# Patient Record
Sex: Male | Born: 1995 | Race: White | Hispanic: No | Marital: Single | Smoking: Never smoker
Health system: Southern US, Community
[De-identification: ages and names within clinical notes are randomized; demographics above are authoritative.]

---
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
## 2019-04-23 ENCOUNTER — Ambulatory Visit: Payer: 59 | Attending: Internal Medicine

## 2019-04-23 DIAGNOSIS — Z20822 Contact with and (suspected) exposure to covid-19: Secondary | ICD-10-CM | POA: Diagnosis not present

## 2019-04-23 MED FILL — BREO ELLIPTA 200-25 MCG INH: 200-25 | 30 days supply | Qty: 60 | Fill #0

## 2019-04-23 MED FILL — ALBUTEROL SULFATE HFA 108 (: 108 (90 BAS | 25 days supply | Qty: 9 | Fill #0

## 2019-04-25 LAB — NOVEL CORONAVIRUS, NAA: SARS-CoV-2, NAA: DETECTED — AB

## 2019-04-25 MED FILL — AZITHROMYCIN 250 MG TABLET: 250 | 5 days supply | Qty: 6 | Fill #0

## 2019-07-23 DIAGNOSIS — T1502XA Foreign body in cornea, left eye, initial encounter: Secondary | ICD-10-CM | POA: Diagnosis not present

## 2019-12-09 DIAGNOSIS — M25531 Pain in right wrist: Secondary | ICD-10-CM | POA: Diagnosis not present

## 2019-12-10 MED FILL — MELOXICAM 7.5 MG TABLET: 7.5 | 30 days supply | Qty: 30 | Fill #0

## 2019-12-11 DIAGNOSIS — S139XXA Sprain of joints and ligaments of unspecified parts of neck, initial encounter: Secondary | ICD-10-CM | POA: Diagnosis not present

## 2019-12-17 DIAGNOSIS — S139XXA Sprain of joints and ligaments of unspecified parts of neck, initial encounter: Secondary | ICD-10-CM | POA: Diagnosis not present

## 2020-05-14 DIAGNOSIS — Z1159 Encounter for screening for other viral diseases: Secondary | ICD-10-CM | POA: Diagnosis not present

## 2021-06-22 ENCOUNTER — Encounter: Payer: Self-pay | Admitting: Emergency Medicine

## 2021-06-22 ENCOUNTER — Ambulatory Visit
Admission: EM | Admit: 2021-06-22 | Discharge: 2021-06-22 | Disposition: A | Discharge status: Home / Self Care | Payer: 59 | Attending: Emergency Medicine | Admitting: Emergency Medicine

## 2021-06-22 DIAGNOSIS — J302 Other seasonal allergic rhinitis: Secondary | ICD-10-CM

## 2021-06-22 MED ORDER — IPRATROPIUM BROMIDE 0.06 % NA SOLN: 2.0000 | Freq: Four times a day (QID) | NASAL | 2 refills | Status: DC

## 2021-06-22 MED ORDER — IBUPROFEN 400 MG PO TABS: 400.0000 mg | ORAL_TABLET | Freq: Three times a day (TID) | ORAL | 0 refills | Status: AC | PRN

## 2021-06-22 MED ORDER — FLUTICASONE PROPIONATE 50 MCG/ACT NA SUSP: 2.0000 | Freq: Every day | NASAL | 1 refills | Status: DC

## 2021-06-22 MED ORDER — CETIRIZINE HCL 10 MG PO TABS: 10.0000 mg | ORAL_TABLET | Freq: Every day | ORAL | 1 refills | Status: DC

## 2021-06-22 NOTE — ED Triage Notes (Signed)
Last week had dry cough and some congestion. Now cough become more congested. Reports headache.  ?

## 2021-06-22 NOTE — Discharge Instructions (Addendum)
Your symptoms and my physical exam findings are concerning for exacerbation of your underlying allergies.  It is important that you are consistent with taking allergy medications exactly as prescribed.   ?  ?Please see the list below for recommended medications, dosages and frequencies to provide relief of your current symptoms:   ?  ?Zyrtec (cetirizine): This is an excellent second-generation antihistamine that helps to reduce respiratory inflammatory response to environmental allergens.  In some patients, this medication can cause daytime sleepiness so I recommend that you take 1 tablet daily at bedtime.   ?  ?Flonase (fluticasone): This is a steroid nasal spray that you use once daily, 1 spray in each nare.  This medication does not work well if you decide to use it only used as you feel you need to, it works best used on a daily basis.  After 3 to 5 days of use, you will notice significant reduction of the inflammation and mucus production that is currently being caused by exposure to allergens, whether seasonal or environmental.  The most common side effect of this medication is nosebleeds.  If you experience a nosebleed, please discontinue use for 1 week, then feel free to resume.  I have provided you with a prescription but you can also purchase this medication over-the-counter if your insurance will not cover it. ?  ?Ipratropium (Atrovent): This is an excellent nasal decongestant spray that does not cause rebound congestion, please instill 2 sprays into each nare with each use.  Because nasal steroids can take several days before they begin to provide full benefit, I recommend that you use this spray in addition to the nasal steroid prescribed for you.  Please use it after you have used your nasal steroid and repeat up to 4 times daily as needed.  I have provided you with a prescription for this medication.    ?  ?Ibuprofen  (Advil, Motrin): This is a good anti-inflammatory medication which not only  addresses aches, pains but also significantly reduces soft tissue inflammation of the upper airways that causes sinus and nasal congestion as well as inflammation of the lower airways which makes you feel like your breathing is constricted or your cough feel tight.  I recommend that you take between 400 to 600 mg every 6-8 hours as needed.    ?  ?If you find that you have not had significant relief of your symptoms in the next 7 to 10 days, please follow-up with your primary care provider ?  ?Thank you for visiting urgent care today.  We appreciate the opportunity to participate in your care. ? ?

## 2021-06-22 NOTE — ED Provider Notes (Signed)
[facility]    CSN: [id number] Arrival date & time: 06/22/21  1117    HISTORY   Chief Complaint  Patient presents with   Cough   HPI Carl Wilkerson is a 26 y.o. male. Last week had dry cough and some congestion. Now cough become more congested. Reports headache.  Reports history of asthma when he was younger but has not had to use inhalers for about 10 years.  The history is provided by the patient.  History reviewed. No pertinent past medical history. There are no problems to display for this patient.  History reviewed. No pertinent surgical history.  Home Medications    Prior to Admission medications   Medication Sig Start Date End Date Taking? Authorizing Provider  cetirizine (ZYRTEC) 10 MG tablet Take 1 tablet (10 mg total) by mouth daily. 06/22/21 12/19/21 Yes Theadora Rama Scales, PA-C  fluticasone (FLONASE) 50 MCG/ACT nasal spray Place 2 sprays into both nostrils daily. 06/22/21  Yes Theadora Rama Scales, PA-C  ibuprofen (ADVIL) 400 MG tablet Take 1 tablet (400 mg total) by mouth every 8 (eight) hours as needed for up to 30 doses. 06/22/21  Yes Theadora Rama Scales, PA-C  ipratropium (ATROVENT) 0.06 % nasal spray Place 2 sprays into both nostrils 4 (four) times daily. As needed for nasal congestion, runny nose 06/22/21  Yes Theadora Rama Scales, PA-C   Family History No family history on file. Social History Social History   Tobacco Use   Smoking status: Never   Smokeless tobacco: Never  Substance Use Topics   Alcohol use: Not Currently   Allergies   Patient has no known allergies.  Review of Systems Review of Systems Pertinent findings noted in history of present illness.   Physical Exam Triage Vital Signs ED Triage Vitals  Enc Vitals Group     BP 02/12/21 0827 (!) 147/82     Pulse Rate 02/12/21 0827 72     Resp 02/12/21 0827 18     Temp 02/12/21 0827 98.3 F (36.8 C)     Temp Source 02/12/21 0827 Oral     SpO2 02/12/21 0827 98 %      Weight --      Height --      Head Circumference --      Peak Flow --      Pain Score 02/12/21 0826 5     Pain Loc --      Pain Edu? --      Excl. in GC? --   No data found.  Updated Vital Signs BP 108/72 (BP Location: Left Arm)    Pulse 61    Temp 98.4 F (36.9 C) (Oral)    Resp 15    SpO2 95%   Physical Exam Vitals and nursing note reviewed.  Constitutional:      General: He is not in acute distress.    Appearance: Normal appearance. He is not ill-appearing.  HENT:     Head: Normocephalic and atraumatic.     Salivary Glands: Right salivary gland is not diffusely enlarged or tender. Left salivary gland is not diffusely enlarged or tender.     Right Ear: Ear canal and external ear normal. No drainage. A middle ear effusion is present. There is no impacted cerumen. Tympanic membrane is bulging. Tympanic membrane is not injected or erythematous.     Left Ear: Ear canal and external ear normal. No drainage. A middle ear effusion is present. There is no impacted cerumen. Tympanic membrane is  bulging. Tympanic membrane is not injected or erythematous.     Ears:     Comments: Bilateral EACs normal, both TMs bulging with clear fluid    Nose: Rhinorrhea present. No nasal deformity, septal deviation, signs of injury, nasal tenderness, mucosal edema or congestion. Rhinorrhea is clear.     Right Nostril: Occlusion present. No foreign body, epistaxis or septal hematoma.     Left Nostril: Occlusion present. No foreign body, epistaxis or septal hematoma.     Right Turbinates: Enlarged, swollen and pale.     Left Turbinates: Enlarged, swollen and pale.     Right Sinus: No maxillary sinus tenderness or frontal sinus tenderness.     Left Sinus: No maxillary sinus tenderness or frontal sinus tenderness.     Mouth/Throat:     Lips: Pink. No lesions.     Mouth: Mucous membranes are moist. No oral lesions.     Pharynx: Oropharynx is clear. Uvula midline. No posterior oropharyngeal erythema or uvula  swelling.     Tonsils: No tonsillar exudate. 0 on the right. 0 on the left.     Comments: Postnasal drip Eyes:     General: Lids are normal.        Right eye: No discharge.        Left eye: No discharge.     Extraocular Movements: Extraocular movements intact.     Conjunctiva/sclera: Conjunctivae normal.     Right eye: Right conjunctiva is not injected.     Left eye: Left conjunctiva is not injected.  Neck:     Trachea: Trachea and phonation normal.  Cardiovascular:     Rate and Rhythm: Normal rate and regular rhythm.     Pulses: Normal pulses.     Heart sounds: Normal heart sounds. No murmur heard.   No friction rub. No gallop.  Pulmonary:     Effort: Pulmonary effort is normal. No accessory muscle usage, prolonged expiration or respiratory distress.     Breath sounds: Normal breath sounds. No stridor, decreased air movement or transmitted upper airway sounds. No decreased breath sounds, wheezing, rhonchi or rales.  Chest:     Chest wall: No tenderness.  Musculoskeletal:        General: Normal range of motion.     Cervical back: Normal range of motion and neck supple. Normal range of motion.  Lymphadenopathy:     Cervical: No cervical adenopathy.  Skin:    General: Skin is warm and dry.     Findings: No erythema or rash.  Neurological:     General: No focal deficit present.     Mental Status: He is alert and oriented to person, place, and time.  Psychiatric:        Mood and Affect: Mood normal.        Behavior: Behavior normal.    Visual Acuity Right Eye Distance:   Left Eye Distance:   Bilateral Distance:    Right Eye Near:   Left Eye Near:    Bilateral Near:     UC Couse / Diagnostics / Procedures:    EKG  Radiology No results found.  Procedures Procedures (including critical care time)  UC Diagnoses / Final Clinical Impressions(s)   I have reviewed the triage vital signs and the nursing notes.  Pertinent labs & imaging results that were available  during my care of the patient were reviewed by me and considered in my medical decision making (see chart for details).   Final diagnoses:  Seasonal allergic  rhinitis, unspecified trigger   Patient provided with prescriptions for cetirizine, Flonase, ibuprofen ipratropium nasal spray.  Recommend patient continue these medications through spring.  Return precautions advised.  No indication for viral or bacterial testing due to no signs of infection this time.  ED Prescriptions     Medication Sig Dispense Auth. Provider   cetirizine (ZYRTEC) 10 MG tablet Take 1 tablet (10 mg total) by mouth daily. 90 tablet Theadora Rama Scales, PA-C   fluticasone (FLONASE) 50 MCG/ACT nasal spray Place 2 sprays into both nostrils daily. 54 mL Theadora Rama Scales, PA-C   ipratropium (ATROVENT) 0.06 % nasal spray Place 2 sprays into both nostrils 4 (four) times daily. As needed for nasal congestion, runny nose 15 mL Theadora Rama Scales, PA-C   ibuprofen (ADVIL) 400 MG tablet Take 1 tablet (400 mg total) by mouth every 8 (eight) hours as needed for up to 30 doses. 30 tablet Theadora Rama Scales, PA-C      PDMP not reviewed this encounter.  Pending results:  Labs Reviewed - No data to display  Medications Ordered in UC: Medications - No data to display  Disposition Upon Discharge:  Condition: stable for discharge home Home: take medications as prescribed; routine discharge instructions as discussed; follow up as advised.  Patient presented with an acute illness with associated systemic symptoms and significant discomfort requiring urgent management. In my opinion, this is a condition that a prudent lay person (someone who possesses an average knowledge of health and medicine) may potentially expect to result in complications if not addressed urgently such as respiratory distress, impairment of bodily function or dysfunction of bodily organs.   Routine symptom specific, illness specific and/or  disease specific instructions were discussed with the patient and/or caregiver at length.   As such, the patient has been evaluated and assessed, work-up was performed and treatment was provided in alignment with urgent care protocols and evidence based medicine.  Patient/parent/caregiver has been advised that the patient may require follow up for further testing and treatment if the symptoms continue in spite of treatment, as clinically indicated and appropriate.  If the patient was tested for COVID-19, Influenza and/or RSV, then the patient/parent/guardian was advised to isolate at home pending the results of his/her diagnostic coronavirus test and potentially longer if theyre positive. I have also advised pt that if his/her COVID-19 test returns positive, it's recommended to self-isolate for at least 10 days after symptoms first appeared AND until fever-free for 24 hours without fever reducer AND other symptoms have improved or resolved. Discussed self-isolation recommendations as well as instructions for household member/close contacts as per the [facility] and [place] DHHS, and also gave patient the COVID packet with this information.  Patient/parent/caregiver has been advised to return to the [facility] or PCP in 3-5 days if no better; to PCP or the Emergency Department if new signs and symptoms develop, or if the current signs or symptoms continue to change or worsen for further workup, evaluation and treatment as clinically indicated and appropriate  The patient will follow up with their current PCP if and as advised. If the patient does not currently have a PCP we will assist them in obtaining one.   The patient may need specialty follow up if the symptoms continue, in spite of conservative treatment and management, for further workup, evaluation, consultation and treatment as clinically indicated and appropriate.  Patient/parent/caregiver verbalized understanding and agreement of plan as discussed.  All  questions were addressed during visit.  Please see discharge  instructions below for further details of plan.  Discharge Instructions:   Discharge Instructions      Your symptoms and my physical exam findings are concerning for exacerbation of your underlying allergies.  It is important that you are consistent with taking allergy medications exactly as prescribed.     Please see the list below for recommended medications, dosages and frequencies to provide relief of your current symptoms:     Zyrtec (cetirizine): This is an excellent second-generation antihistamine that helps to reduce respiratory inflammatory response to environmental allergens.  In some patients, this medication can cause daytime sleepiness so I recommend that you take 1 tablet daily at bedtime.     Flonase (fluticasone): This is a steroid nasal spray that you use once daily, 1 spray in each nare.  This medication does not work well if you decide to use it only used as you feel you need to, it works best used on a daily basis.  After 3 to 5 days of use, you will notice significant reduction of the inflammation and mucus production that is currently being caused by exposure to allergens, whether seasonal or environmental.  The most common side effect of this medication is nosebleeds.  If you experience a nosebleed, please discontinue use for 1 week, then feel free to resume.  I have provided you with a prescription but you can also purchase this medication over-the-counter if your insurance will not cover it.   Ipratropium (Atrovent): This is an excellent nasal decongestant spray that does not cause rebound congestion, please instill 2 sprays into each nare with each use.  Because nasal steroids can take several days before they begin to provide full benefit, I recommend that you use this spray in addition to the nasal steroid prescribed for you.  Please use it after you have used your nasal steroid and repeat up to 4 times daily as  needed.  I have provided you with a prescription for this medication.      Ibuprofen  (Advil, Motrin): This is a good anti-inflammatory medication which not only addresses aches, pains but also significantly reduces soft tissue inflammation of the upper airways that causes sinus and nasal congestion as well as inflammation of the lower airways which makes you feel like your breathing is constricted or your cough feel tight.  I recommend that you take between 400 to 600 mg every 6-8 hours as needed.      If you find that you have not had significant relief of your symptoms in the next 7 to 10 days, please follow-up with your primary care provider   Thank you for visiting urgent care today.  We appreciate the opportunity to participate in your care.     This office note has been dictated using Teaching laboratory technician.  Unfortunately, and despite my best efforts, this method of dictation can sometimes lead to occasional typographical or grammatical errors.  I apologize in advance if this occurs.     Theadora Rama Scales, PA-C 06/22/21 1535

## 2021-07-20 DIAGNOSIS — L237 Allergic contact dermatitis due to plants, except food: Secondary | ICD-10-CM | POA: Diagnosis not present

## 2022-02-28 ENCOUNTER — Telehealth: Payer: 59 | Admitting: Family

## 2022-02-28 DIAGNOSIS — J4520 Mild intermittent asthma, uncomplicated: Secondary | ICD-10-CM | POA: Diagnosis not present

## 2022-02-28 DIAGNOSIS — U071 COVID-19: Secondary | ICD-10-CM

## 2022-02-28 MED ORDER — ALBUTEROL SULFATE HFA 108 (90 BASE) MCG/ACT IN AERS: 2.0000 | INHALATION_SPRAY | Freq: Four times a day (QID) | RESPIRATORY_TRACT | 0 refills | Status: AC | PRN

## 2022-02-28 MED ORDER — MOLNUPIRAVIR EUA 200MG CAPSULE: 4.0000 | ORAL_CAPSULE | Freq: Two times a day (BID) | ORAL | 0 refills | Status: AC

## 2022-02-28 MED ORDER — BENZONATATE 100 MG PO CAPS: 100.0000 mg | ORAL_CAPSULE | Freq: Three times a day (TID) | ORAL | 0 refills | Status: AC | PRN

## 2022-02-28 NOTE — Patient Instructions (Signed)

## 2022-02-28 NOTE — Progress Notes (Signed)
Virtual Visit Consent   Carl Wilkerson, you are scheduled for a virtual visit with a [facility] provider today. Just as with appointments in the office, your consent must be obtained to participate. Your consent will be active for this visit and any virtual visit you may have with one of our providers in the next 365 days. If you have a MyChart account, a copy of this consent can be sent to you electronically.  As this is a virtual visit, video technology does not allow for your provider to perform a traditional examination. This may limit your provider's ability to fully assess your condition. If your provider identifies any concerns that need to be evaluated in person or the need to arrange testing (such as labs, EKG, etc.), we will make arrangements to do so. Although advances in technology are sophisticated, we cannot ensure that it will always work on either your end or our end. If the connection with a video visit is poor, the visit may have to be switched to a telephone visit. With either a video or telephone visit, we are not always able to ensure that we have a secure connection.  By engaging in this virtual visit, you consent to the provision of healthcare and authorize for your insurance to be billed (if applicable) for the services provided during this visit. Depending on your insurance coverage, you may receive a charge related to this service.  I need to obtain your verbal consent now. Are you willing to proceed with your visit today? Carl Wilkerson has provided verbal consent on 02/28/2022 for a virtual visit (video or telephone). Jannifer Rodney, FNP  Date: 02/28/2022 6:29 PM  Virtual Visit via Video Note   I, Jannifer Rodney, connected with  Carl Wilkerson  ([id number], 1995/10/27) on 02/28/22 at  6:30 PM EST by a video-enabled telemedicine application and verified that I am speaking with the correct person using two identifiers.  Location: Patient: Virtual Visit Location  Patient: Home Provider: Virtual Visit Location Provider: Home Office   I discussed the limitations of evaluation and management by telemedicine and the availability of in person appointments. The patient expressed understanding and agreed to proceed.    History of Present Illness: Carl Wilkerson is a 26 y.o. who identifies as a male who was assigned male at birth, and is being seen today for COVID. He reports his symptoms started today and tested positive today.   HPI: Cough This is a new problem. The current episode started today. The problem has been unchanged. The cough is Non-productive. Associated symptoms include a fever, headaches, myalgias, nasal congestion and postnasal drip. Pertinent negatives include no chills, ear congestion, ear pain, shortness of breath or wheezing. He has tried rest and OTC cough suppressant for the symptoms. The treatment provided mild relief.    Problems: There are no problems to display for this patient.   Allergies: No Known Allergies Medications:  Current Outpatient Medications:    albuterol (VENTOLIN HFA) 108 (90 Base) MCG/ACT inhaler, Inhale 2 puffs into the lungs every 6 (six) hours as needed for wheezing or shortness of breath., Disp: 8 g, Rfl: 0   benzonatate (TESSALON PERLES) 100 MG capsule, Take 1 capsule (100 mg total) by mouth 3 (three) times daily as needed., Disp: 20 capsule, Rfl: 0   molnupiravir EUA (LAGEVRIO) 200 mg CAPS capsule, Take 4 capsules (800 mg total) by mouth 2 (two) times daily for 5 days., Disp: 40 capsule, Rfl: 0   ibuprofen (ADVIL)  400 MG tablet, Take 1 tablet (400 mg total) by mouth every 8 (eight) hours as needed for up to 30 doses., Disp: 30 tablet, Rfl: 0  Observations/Objective: Patient is well-developed, well-nourished in no acute distress.  Resting comfortably  at home.  Head is normocephalic, atraumatic.  No labored breathing.  Speech is clear and coherent with logical content.  Patient is alert and oriented at  baseline.  Nasal congestion  Assessment and Plan: 1. COVID-19 - molnupiravir EUA (LAGEVRIO) 200 mg CAPS capsule; Take 4 capsules (800 mg total) by mouth 2 (two) times daily for 5 days.  Dispense: 40 capsule; Refill: 0 - albuterol (VENTOLIN HFA) 108 (90 Base) MCG/ACT inhaler; Inhale 2 puffs into the lungs every 6 (six) hours as needed for wheezing or shortness of breath.  Dispense: 8 g; Refill: 0 - benzonatate (TESSALON PERLES) 100 MG capsule; Take 1 capsule (100 mg total) by mouth 3 (three) times daily as needed.  Dispense: 20 capsule; Refill: 0  2. Mild intermittent asthma without complication  COVID positive, rest, force fluids, tylenol as needed, Quarantine for at least 5 days and you are fever free, then must wear a mask out in public from day 6-10, report any worsening symptoms such as increased shortness of breath, swelling, or continued high fevers. Possible adverse effects discussed with antivirals.    Follow Up Instructions: I discussed the assessment and treatment plan with the patient. The patient was provided an opportunity to ask questions and all were answered. The patient agreed with the plan and demonstrated an understanding of the instructions.  A copy of instructions were sent to the patient via MyChart unless otherwise noted below.     The patient was advised to call back or seek an in-person evaluation if the symptoms worsen or if the condition fails to improve as anticipated.  Time:  I spent 5 minutes with the patient via telehealth technology discussing the above problems/concerns.    Jannifer Rodney, FNP

## 2022-09-23 ENCOUNTER — Other Ambulatory Visit (HOSPITAL_BASED_OUTPATIENT_CLINIC_OR_DEPARTMENT_OTHER): Payer: Self-pay

## 2023-03-07 ENCOUNTER — Telehealth: Payer: 59 | Admitting: Family Medicine

## 2023-03-07 DIAGNOSIS — R21 Rash and other nonspecific skin eruption: Secondary | ICD-10-CM

## 2023-03-07 DIAGNOSIS — L237 Allergic contact dermatitis due to plants, except food: Secondary | ICD-10-CM | POA: Diagnosis not present

## 2023-03-07 NOTE — Progress Notes (Signed)
Because given the location and risk of serious infection, along with eye involvement- your condition warrants further evaluation and I recommend that you be seen in a face to face visit at the local urgent care.   NOTE: There will be NO CHARGE for this eVisit   If you are having a true medical emergency please call 911.
